# Patient Record
Sex: Male | Born: 1960 | Race: White | Hispanic: No | Marital: Single | Smoking: Former smoker
Health system: Southern US, Community
[De-identification: ages and names within clinical notes are randomized; demographics above are authoritative.]

## PROBLEM LIST (undated history)

## (undated) DIAGNOSIS — R030 Elevated blood-pressure reading, without diagnosis of hypertension: Secondary | ICD-10-CM

## (undated) HISTORY — DX: Elevated blood-pressure reading, without diagnosis of hypertension: R03.0

---

## 2014-01-05 ENCOUNTER — Encounter: Payer: Self-pay | Admitting: Emergency Medicine

## 2014-01-05 ENCOUNTER — Emergency Department
Admission: EM | Admit: 2014-01-05 | Discharge: 2014-01-05 | Disposition: A | Discharge status: Home / Self Care | Payer: BC Managed Care – PPO | Source: Home / Self Care | Attending: Family Medicine | Admitting: Family Medicine

## 2014-01-05 DIAGNOSIS — B9789 Other viral agents as the cause of diseases classified elsewhere: Secondary | ICD-10-CM

## 2014-01-05 DIAGNOSIS — J069 Acute upper respiratory infection, unspecified: Secondary | ICD-10-CM

## 2014-01-05 DIAGNOSIS — J029 Acute pharyngitis, unspecified: Secondary | ICD-10-CM

## 2014-01-05 LAB — POCT RAPID STREP A (OFFICE): Rapid Strep A Screen: NEGATIVE

## 2014-01-05 MED ORDER — AZITHROMYCIN 250 MG PO TABS: ORAL_TABLET | ORAL | Status: DC

## 2014-01-05 MED ORDER — BENZONATATE 200 MG PO CAPS: 200.0000 mg | ORAL_CAPSULE | Freq: Every day | ORAL | Status: DC

## 2014-01-05 NOTE — ED Notes (Signed)
Onset of sore throat, fever, cough, body aches, dizziness, nausea and headaches since Friday.

## 2014-01-05 NOTE — ED Provider Notes (Signed)
CSN: [id number]     Arrival date & time 01/05/14  1136 History   First MD Initiated Contact with Patient 01/05/14 1209     Chief Complaint  Patient presents with  . Sore Throat      HPI Comments: Four days ago patient developed nasal congestion and ears felt clogged.  He then developed sore throat, chills/sweats, fatigue, myalgias, and a cough.  The history is provided by the patient.    History reviewed. No pertinent past medical history. History reviewed. No pertinent past surgical history. History reviewed. No pertinent family history. History  Substance Use Topics  . Smoking status: Former Smoker    Quit date: 01/06/1984  . Smokeless tobacco: Not on file  . Alcohol Use: 5.0 oz/week    10 drink(s) per week    Review of Systems + sore throat + cough No pleuritic pain ? wheezing + nasal congestion + post-nasal drainage No sinus pain/pressure No itchy/red eyes No earache No hemoptysis No SOB No fever, + chills + nausea No vomiting No abdominal pain No diarrhea No urinary symptoms No skin rash + fatigue + myalgias + headache Used OTC meds without relief  Allergies  Review of patient's allergies indicates no known allergies.  Home Medications   Prior to Admission medications   Medication Sig Start Date End Date Taking? Authorizing Provider  azithromycin (ZITHROMAX Z-PAK) 250 MG tablet Take 2 tabs today; then begin one tab once daily for 4 more days. (Rx void after 01/13/14) 01/05/14   Lattie Haw, MD  benzonatate (TESSALON) 200 MG capsule Take 1 capsule (200 mg total) by mouth at bedtime. Take as needed for cough 01/05/14   Lattie Haw, MD   BP 127/88  Pulse 105  Temp(Src) 99.2 F (37.3 C) (Oral)  Resp 20  Ht  (1.905 m)  Wt 227 lb (102.967 kg)  BMI 28.37 kg/m2  SpO2 94% Physical Exam Nursing notes and Vital Signs reviewed. Appearance:  Patient appears healthy, stated age, and in no acute distress Eyes:  Pupils are equal, round, and reactive  to light and accomodation.  Extraocular movement is intact.  Conjunctivae are not inflamed  Ears:  Canals normal.  Tympanic membranes normal.  Nose:  Mildly congested turbinates.  No sinus tenderness.  Pharynx:  Normal Neck:  Supple.  Slightly tender shotty posterior nodes are palpated bilaterally  Lungs:  Clear to auscultation.  Breath sounds are equal.  Heart:  Regular rate and rhythm without murmurs, rubs, or gallops.  Abdomen:  Nontender without masses or hepatosplenomegaly.  Bowel sounds are present.  No CVA or flank tenderness.  Extremities:  No edema.  No calf tenderness Skin:  No rash present.   ED Course  Procedures      Labs Reviewed  POCT RAPID STREP A (OFFICE) negative      MDM   1. Acute pharyngitis, unspecified pharyngitis type   2. Viral URI with cough    There is no evidence of bacterial infection today.  Treat symptomatically for now  Prescription written for Benzonatate (Tessalon) to take at bedtime for night-time cough.  Take plain Mucinex (1200 mg guaifenesin) twice daily for cough and congestion.  May add Sudafed for sinus congestion.   Increase fluid intake, rest. May use Afrin nasal spray (or generic oxymetazoline) twice daily for about 5 days.  Also recommend using saline nasal spray several times daily and saline nasal irrigation (AYR is a common brand) Try warm salt water gargles for sore throat.  Stop  all antihistamines for now, and other non-prescription cough/cold preparations. May take Ibuprofen , 4 tabs every 8 hours with food for sore throat, fever, headache, etc. Begin Azithromycin if not improving about one week or if persistent fever develops (Given a prescription to hold, with an expiration date)  Follow-up with family doctor if not improving about10 days.     Lattie Haw, MD 01/10/14 [phone]

## 2014-01-05 NOTE — Discharge Instructions (Signed)
Take plain Mucinex (1200 mg guaifenesin) twice daily for cough and congestion.  May add Sudafed for sinus congestion.   Increase fluid intake, rest. May use Afrin nasal spray (or generic oxymetazoline) twice daily for about 5 days.  Also recommend using saline nasal spray several times daily and saline nasal irrigation (AYR is a common brand) Try warm salt water gargles for sore throat.  Stop all antihistamines for now, and other non-prescription cough/cold preparations. May take Ibuprofen , 4 tabs every 8 hours with food for sore throat, fever, headache, etc. Begin Azithromycin if not improving about one week or if persistent fever develops  Follow-up with family doctor if not improving about10 days.

## 2017-04-10 ENCOUNTER — Ambulatory Visit: Payer: BLUE CROSS/BLUE SHIELD | Admitting: Osteopathic Medicine

## 2017-04-10 ENCOUNTER — Telehealth: Payer: Self-pay | Admitting: Osteopathic Medicine

## 2017-04-10 NOTE — Telephone Encounter (Signed)
NO SHOW 04/10/17 to establish care with Dr Lyn HollingsheadAlexander  Please call and make sure everything ok and ask him to reschedule.

## 2017-04-10 NOTE — Telephone Encounter (Signed)
This was addressed by Selena BattenKim up front, no further action at this time

## 2017-04-11 ENCOUNTER — Ambulatory Visit (INDEPENDENT_AMBULATORY_CARE_PROVIDER_SITE_OTHER): Payer: BLUE CROSS/BLUE SHIELD | Admitting: Osteopathic Medicine

## 2017-04-11 ENCOUNTER — Encounter (INDEPENDENT_AMBULATORY_CARE_PROVIDER_SITE_OTHER): Payer: Self-pay

## 2017-04-11 ENCOUNTER — Encounter: Payer: Self-pay | Admitting: Osteopathic Medicine

## 2017-04-11 VITALS — BP 140/92

## 2017-04-11 DIAGNOSIS — Z13220 Encounter for screening for lipoid disorders: Secondary | ICD-10-CM | POA: Diagnosis not present

## 2017-04-11 DIAGNOSIS — F5109 Other insomnia not due to a substance or known physiological condition: Secondary | ICD-10-CM | POA: Insufficient documentation

## 2017-04-11 DIAGNOSIS — R030 Elevated blood-pressure reading, without diagnosis of hypertension: Secondary | ICD-10-CM

## 2017-04-11 DIAGNOSIS — Z23 Encounter for immunization: Secondary | ICD-10-CM | POA: Diagnosis not present

## 2017-04-11 DIAGNOSIS — F418 Other specified anxiety disorders: Secondary | ICD-10-CM | POA: Insufficient documentation

## 2017-04-11 HISTORY — DX: Elevated blood-pressure reading, without diagnosis of hypertension: R03.0

## 2017-04-11 MED ORDER — ESCITALOPRAM OXALATE 10 MG PO TABS: 10.0000 mg | ORAL_TABLET | Freq: Every day | ORAL | 1 refills | Status: DC

## 2017-04-11 MED ORDER — CLONAZEPAM 0.5 MG PO TABS: 0.5000 mg | ORAL_TABLET | Freq: Two times a day (BID) | ORAL | 1 refills | Status: DC | PRN

## 2017-04-11 NOTE — Progress Notes (Signed)
HPI: Philip Sutton is a 56 y.o. male who  has no past medical history on file.  he presents [facility] today, 04/11/17,  for chief complaint of:  Chief Complaint  Patient presents with  . Establish Care    Insomnia, Bloodwork,     Would like to get blood work done, concerned about cholesterol, thyroid, etc.   Concerned about insomnia issus, stress at work, personal life. Doesn't go into detail, but anticipates these issues will be present for a few months at least. In the past, he was on Ambien and Lunesta and these caused intolerable side effects and daytime drowsiness/frothiness such that he was not able to work effectively. She was on clonazepam daily at bedtime for a few months and came off of this without any problems.  At this point, he has some concerns about anxious depression, inability to turn off his thoughts, this seems to be the main cause of his insomnia problems.  BP a little high also on intake.      Past medical, surgical, social and family history reviewed:  There are no active problems to display for this patient.  History reviewed. No pertinent surgical history.  Social History   Tobacco Use  . Smoking status: Former Smoker    Last attempt to quit: 01/06/1984    Years since quitting: 33.2  Substance Use Topics  . Alcohol use: Yes    Alcohol/week: 5.0 oz    Types: 10 drink(s) per week   History reviewed. No pertinent family history.   Current medication list and allergy/intolerance information reviewed:    No current outpatient medications on file.   No current facility-administered medications for this visit.     No Known Allergies    Review of Systems:  Constitutional:  No  fever, no chills, No recent illness, No unintentional weight changes. No significant fatigue.   HEENT: No  headache, no vision change, no hearing change, No sore throat, No  sinus pressure  Cardiac: No  chest pain, No  pressure, No  palpitations, No  Orthopnea  Respiratory:  No  shortness of breath. No  Cough  Gastrointestinal: No  abdominal pain, No  nausea, No  vomiting,  No  blood in stool, No  diarrhea, No  constipation   Musculoskeletal: No new myalgia/arthralgia  Genitourinary: No  incontinence, No  abnormal genital bleeding, No abnormal genital discharge  Skin: No  Rash, No other wounds/concerning lesions  Hem/Onc: No  easy bruising/bleeding, No  abnormal lymph node  Endocrine: No cold intolerance,  No heat intolerance. No polyuria/polydipsia/polyphagia   Neurologic: No  weakness, No  dizziness, No  slurred speech/focal weakness/facial droop  Psychiatric: No  concerns with depression, +concerns with anxiety, +sleep problems, No mood problems  Exam:  BP (!) 140/92 (BP Location: Left Arm, Cuff Size: Normal)   Constitutional: VS see above. General Appearance: alert, well-developed, well-nourished, NAD  Eyes: Normal lids and conjunctive, non-icteric sclera  Ears, Nose, Mouth, Throat: MMM, Normal external inspection ears/nares/mouth/lips/gums.   Neck: No masses, trachea midline.   Respiratory: Normal respiratory effort. no wheeze, no rhonchi, no rales  Cardiovascular: S1/S2 normal, no murmur, no rub/gallop auscultated. RRR.   Musculoskeletal: Gait normal.   Neurological: Normal balance/coordination. No tremor. .   Skin: warm, dry  Psychiatric: Normal judgment/insight. Normal mood and affect. Oriented x3.      Depression screen PHQ 2/9 04/11/2017  Decreased Interest 2  Down, Depressed, Hopeless 2  PHQ - 2 Score 4  Altered sleeping 3  Tired, decreased energy 1  Change in appetite 0  Feeling bad or failure about yourself  0  Trouble concentrating 1  Moving slowly or fidgety/restless 1  Suicidal thoughts 0  PHQ-9 Score 10      ASSESSMENT/PLAN:   Long discussion of options for treatment of insomnia/anxiety.   Could certainly try treating for just insomnia, I'm a little reluctant to  use benzodiazepines for this but he seems to do well on this in the past and didn't have any issues coming off of the medications. Could certainly try something like trazodone.   I think given the anxiety issues more as primary cause of insomnia, treating with SSRI plus benzodiazepine bridge seems reasonable to me as first-line therapy for prolonged anxiety issues. Patient and I discussed risks versus benefits of several options including SSRIs/benzodiazepine, benzodiazepine alone, TCA alone.   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 This is a daily medication to help control your symptoms, including insomnia.   I also highly encourage my patients who are suffering from situational issues to seek care with a counselor or therapist. A therapist can coach you in techniques to recognize and deal with troubling thought patterns and behaviors. The ability to cope with external stressors is crucial to overall mental health. I can place a referral to behavioral health for counseling/therapy if you would like.   Try the Clonazepam medicine up to 2 times per day, try half dose first as this drug can cause sleepiness in the daytime, can take whole tablet a tnight. If you don't need it during the day, that's fine. Be aware that this medicine can cause problems with dependence - sparing or  short-term use is best.   Expect a call or message form this office in the next 2 weeks to check in: If you're doing well on the medicine but not feeling any effect, we can increase the dose. If you're starting to feel some effect/improvement, we can hold off on a dose increase and reevaluate at your office visit.   Let's plan to follow up in the office in 4 weeks. At that time, we can talk about how well the medicine is working for you, and we can consider increasing the dose, adding another medicine, etc.   If we are having trouble finding a good medication regimen for you, we can consult with a psychiatrist to assist with medication management.   If you experience problematic side effects, please let me know ASAP - we can switch the medicine any time, and we don't need an appointment for this.     Any questions or concerns, call me!      Visit summary with medication list and pertinent instructions was printed for patient to review. All questions at time of visit were answered - patient instructed to contact office with any additional concerns. ER/RTC precautions were reviewed with the patient. Follow-up plan:  Return in about 4 weeks (around 05/09/2017) for recheck insomnia, sooner if needed .  Note: Total time spent 45 minutes, greater than 50% of the visit was spent face-to-face counseling and coordinating care for the following: The primary encounter diagnosis was Situational insomnia. Diagnoses of Anxious depression, Need for immunization against influenza, Lipid screening, and Elevated blood pressure reading were also pertinent to this visit.Marland Kitchen  Please note: voice recognition software was used to produce this document, and typos may escape review. Please contact Dr. Lyn Hollingshead for any needed clarifications.

## 2017-04-11 NOTE — Patient Instructions (Signed)
We are starting a medication today called lexapro to help treat your anxious depression. This is a daily medication to help control your symptoms, including insomnia.   I also highly encourage my patients who are suffering from situational issues to seek care with a counselor or therapist. A therapist can coach you in techniques to recognize and deal with troubling thought patterns and behaviors. The ability to cope with external stressors is crucial to overall mental health. I can place a referral to behavioral health for counseling/therapy if you would like.   Try the Clonazepam medicine up to 2 times per day, try half dose first as this drug can cause sleepiness in the daytime, can take whole tablet a tnight. If you don't need it during the day, that's fine. Be aware that this medicine can cause problems with dependence - sparing or short-term use is best.   Expect a call or message form this office in the next 2 weeks to check in: If you're doing well on the medicine but not feeling any effect, we can increase the dose. If you're starting to feel some effect/improvement, we can hold off on a dose increase and reevaluate at your office visit.   Let's plan to follow up in the office in 4 weeks. At that time, we can talk about how well the medicine is working for you, and we can consider increasing the dose, adding another medicine, etc.   If we are having trouble finding a good medication regimen for you, we can consult with a psychiatrist to assist with medication management.   If you experience problematic side effects, please let me know ASAP - we can switch the medicine any time, and we don't need an appointment for this.     Any questions or concerns, call me!

## 2017-04-12 ENCOUNTER — Other Ambulatory Visit: Payer: Self-pay | Admitting: Osteopathic Medicine

## 2017-04-12 DIAGNOSIS — R7309 Other abnormal glucose: Secondary | ICD-10-CM

## 2017-04-12 NOTE — Progress Notes (Signed)
A1C for Glc high

## 2017-04-16 LAB — COMPLETE METABOLIC PANEL WITH GFR
AG Ratio: 1.5 (calc) (ref 1.0–2.5)
ALBUMIN MSPROF: 4.4 g/dL (ref 3.6–5.1)
ALT: 18 U/L (ref 9–46)
AST: 18 U/L (ref 10–35)
Alkaline phosphatase (APISO): 68 U/L (ref 40–115)
BUN: 21 mg/dL (ref 7–25)
CO2: 28 mmol/L (ref 20–32)
Calcium: 9.8 mg/dL (ref 8.6–10.3)
Chloride: 105 mmol/L (ref 98–110)
Creat: 1.07 mg/dL (ref 0.70–1.33)
GFR, EST NON AFRICAN AMERICAN: 77 mL/min/{1.73_m2} (ref >=60)
GFR, Est African American: 89 mL/min/{1.73_m2} (ref >=60)
GLOBULIN: 2.9 g/dL (ref 1.9–3.7)
Glucose, Bld: 104 mg/dL — ABNORMAL HIGH (ref 65–99)
Potassium: 4.6 mmol/L (ref 3.5–5.3)
SODIUM: 140 mmol/L (ref 135–146)
Total Bilirubin: 0.7 mg/dL (ref 0.2–1.2)
Total Protein: 7.3 g/dL (ref 6.1–8.1)

## 2017-04-16 LAB — CBC
HEMATOCRIT: 43.1 % (ref 38.5–50.0)
HEMOGLOBIN: 15.5 g/dL (ref 13.2–17.1)
MCH: 32.4 pg (ref 27.0–33.0)
MCHC: 36 g/dL (ref 32.0–36.0)
MCV: 90 fL (ref 80.0–100.0)
MPV: 10.2 fL (ref 7.5–12.5)
Platelets: 265 10*3/uL (ref 140–400)
RBC: 4.79 10*6/uL (ref 4.20–5.80)
RDW: 13 % (ref 11.0–15.0)
WBC: 7.6 10*3/uL (ref 3.8–10.8)

## 2017-04-16 LAB — LIPID PANEL
CHOL/HDL RATIO: 4.5 (calc) (ref <5.0)
Cholesterol: 228 mg/dL — ABNORMAL HIGH (ref <200)
HDL: 51 mg/dL (ref >40)
LDL Cholesterol (Calc): 140 mg/dL (calc) — ABNORMAL HIGH
Non-HDL Cholesterol (Calc): 177 mg/dL (calc) — ABNORMAL HIGH (ref <130)
Triglycerides: 227 mg/dL — ABNORMAL HIGH (ref <150)

## 2017-04-16 LAB — VITAMIN D 25 HYDROXY (VIT D DEFICIENCY, FRACTURES): Vit D, 25-Hydroxy: 23 ng/mL — ABNORMAL LOW (ref 30–100)

## 2017-04-16 LAB — HEMOGLOBIN A1C
EAG (MMOL/L): 5 (calc)
Hgb A1c MFr Bld: 4.8 % of total Hgb (ref <5.7)
Mean Plasma Glucose: 91 (calc)

## 2017-04-16 LAB — TSH: TSH: 2.69 m[IU]/L (ref 0.40–4.50)

## 2017-04-25 ENCOUNTER — Ambulatory Visit (INDEPENDENT_AMBULATORY_CARE_PROVIDER_SITE_OTHER): Payer: BLUE CROSS/BLUE SHIELD

## 2017-04-25 ENCOUNTER — Ambulatory Visit (INDEPENDENT_AMBULATORY_CARE_PROVIDER_SITE_OTHER): Payer: BLUE CROSS/BLUE SHIELD | Admitting: Family Medicine

## 2017-04-25 ENCOUNTER — Telehealth: Payer: Self-pay | Admitting: Osteopathic Medicine

## 2017-04-25 DIAGNOSIS — M25562 Pain in left knee: Secondary | ICD-10-CM

## 2017-04-25 DIAGNOSIS — M25462 Effusion, left knee: Secondary | ICD-10-CM

## 2017-04-25 DIAGNOSIS — M25561 Pain in right knee: Secondary | ICD-10-CM | POA: Diagnosis not present

## 2017-04-25 MED ORDER — DICLOFENAC SODIUM 1 % TD GEL: 4.0000 g | Freq: Four times a day (QID) | TRANSDERMAL | 11 refills | Status: AC

## 2017-04-25 NOTE — Telephone Encounter (Signed)
Patient inquired about changing his Lexapro to Klonopin. He believes Klonopin works better for him. Please advise. Thanks!

## 2017-04-25 NOTE — Patient Instructions (Signed)
Thank you for coming in today. Attend PT.  Do the Eccentric hamstring exercises. Extend your knee slowly against resistance.  Do the The Askling L Protocol for Hamstring Strains  Apply voltaren gel up to 4x daily.  Use a compressive knee sleeve.  I recommend Body Helix full knee.   I do recommend a MRI but we can decide on that later if not better.    Hamstring Strain A hamstring strain is an injury that occurs when the hamstring muscles are overstretched or overloaded. The hamstring muscles are a group of muscles at the back of the thighs. These muscles are used in straightening the hips, bending the knees, and pulling back the legs. This type of injury is often called a pulled hamstring muscle. The severity of a muscle strain is rated in degrees. First-degree strains have the least amount of muscle fiber tearing and pain. Second-degree and third-degree strains have increasingly more tearing and pain. What are the causes? Hamstring strains occur when a sudden, violent force is placed on these muscles and stretches them too far. This often occurs during activities that involve running, jumping, kicking, or weight lifting. What increases the risk? Hamstring strains are especially common in athletes. Other things that can increase your risk for this injury include:  Having low strength, endurance, or flexibility of the hamstring muscles.  Performing high-impact physical activity.  Having poor physical fitness.  Having a previous leg injury.  Having fatigued muscles.  Older age.  What are the signs or symptoms?  Pain in the back of the thigh.  Bruising.  Swelling.  Muscle spasm.  Difficulty using the muscle because of pain or lack of normal function. For severe strains, you may have a popping or snapping feeling when the injury occurs. How is this diagnosed? Your health care provider will perform a physical exam and ask about your medical history. How is this  treated? Often, the best treatment for a hamstring strain is protecting, resting, icing, applying compression, and elevating the injured area. This is referred to as the PRICE method of treatment. Your health care provider may also recommend medicines to help reduce pain or inflammation. Follow these instructions at home:  Use the PRICE method of treatment to promote muscle healing during the first 2-3 days after your injury. The PRICE method involves: ? P-Protecting the muscle from being injured again. ? R-Restricting your activity and resting the injured body part. ? I-Icing your injury. To do this, put ice in a plastic bag. Place a towel between your skin and the bag. Then, apply the ice and leave it on for 20 minutes, 2-3 times per day. After the third day, switch to moist heat packs. ? C-Applying compression to the injured area with an elastic bandage. Be careful not to wrap it too tightly. That may interfere with blood circulation or may increase swelling. ? E-Elevating the injured body part above the level of your heart as often as you can. You can do this by putting a pillow under your thigh when you sit or lie down.  Take medicines only as directed by your health care provider.  Begin exercising or stretching as directed by your health care provider.  Do not return to full activity level until your health care provider approves.  Keep all follow-up visits as directed by your health care provider. This is important. Contact a health care provider if:  You have increasing pain or swelling in the injured area.  You have numbness, tingling, or  a significant loss of strength in the injured area.  Your foot or your toes become cold or turn blue. This information is not intended to replace advice given to you by your health care provider. Make sure you discuss any questions you have with your health care provider. Document Released: 01/17/2001 Document Revised: 09/30/2015 Document  Reviewed: 12/08/2013 [employer] Interactive Patient Education  2018 [employer]

## 2017-04-25 NOTE — Progress Notes (Signed)
Subjective:    I'm seeing this patient as a consultation for: Philip Sutton, Natalie, DO   CC: Left Knee Pain  HPI:  Philip MaduroRobert has pain at the left posterior medial knee for years.  He notes the pain is worse when he plays racquetball better with rest.  He notes pain and swelling.  He denies significant locking or catching.  He uses a patella strap which does help some.  He denies any radiating pain weakness or numbness.  Racquetball is very important to him and help some exercise and improve his quality of life.  He had a period of time a few years ago when he was not playing racquetball and he did not have any knee pain.  He denies any fevers or chills nausea vomiting diarrhea weight loss.   Past medical history, Surgical history, Family history not pertinant except as noted below, Social history, Allergies, and medications have been entered into the medical record, reviewed, and no changes needed.   Review of Systems: No headache, visual changes, nausea, vomiting, diarrhea, constipation, dizziness, abdominal pain, skin rash, fevers, chills, night sweats, weight loss, swollen lymph nodes, body aches, joint swelling, muscle aches, chest pain, shortness of breath, mood changes, visual or auditory hallucinations.   Objective:    Vitals:   04/25/17 0850  BP: (!) 155/90  Pulse: 62   General: Well Developed, well nourished, and in no acute distress.  Neuro/Psych: Alert and oriented x3, extra-ocular muscles intact, able to move all 4 extremities, sensation grossly intact. Skin: Warm and dry, no rashes noted.  Respiratory: Not using accessory muscles, speaking in full sentences, trachea midline.  Cardiovascular: Pulses palpable, no extremity edema. Abdomen: Does not appear distended. MSK: Left knee normal-appearing no effusion.  No erythema. Small palpable mildly tender nodule present at the left posterior medial knee near the insertion of the semitendinosus tendon. Range of motion is normal at  0-120 degrees. Stable ligamentous exam. Intact flexion and extension strength. Normal gait.  Limited musculoskeletal ultrasound of the left posterior knee reveals a heterogeneous appearance structure with some hyperechoic, hypoechoic changes and occasional scattered Doppler flow indicating neovascularity.  No significant blood flow is seen within the abnormal appearing structure.  Bony structures are otherwise normal-appearing Impression: Poorly healed muscle tear versus partial Baker's cyst versus malignancy  No results found for this or any previous visit (from the past 24 hour(s)). Dg Knee 1-2 Views Right  Result Date: 04/25/2017 CLINICAL DATA:  Knee pain EXAM: RIGHT KNEE - 1-2 VIEW COMPARISON:  None. FINDINGS: No evidence of fracture, dislocation, or joint effusion. No evidence of arthropathy or other focal bone abnormality. Soft tissues are unremarkable. IMPRESSION: Negative. Electronically Signed   By: Marlan Palauharles  Clark M.D.   On: 04/25/2017 13:01   Dg Knee Complete 4 Views Left  Result Date: 04/25/2017 CLINICAL DATA:  Knee pain EXAM: LEFT KNEE - COMPLETE 4+ VIEW COMPARISON:  None. FINDINGS: Moderate joint space narrowing medially without significant spurring. Lateral joint space normal. Mild patellofemoral spurring with a small joint effusion. Negative for fracture. Varus deformity of the knee. IMPRESSION: Moderate joint space narrowing in the medial joint compartment. Small joint effusion. Mild patellofemoral degenerative change. Electronically Signed   By: Marlan Palauharles  Clark M.D.   On: 04/25/2017 13:02    Impression and Recommendations:    Assessment and Plan: 56 y.o. male with left posterior knee pain.  This is likely a poorly healed medial hamstring tear or strain.  However based on the appearance of ultrasound I do think an  MRI with IV contrast will be helpful to rule out malignancy.  Mr. Philip Sutton is reluctant to pursue this further.  He notes that the symptoms have been ongoing for years and  have not worsened.  I also think it is unlikely that he has a malignancy.  In the meantime we will treat with referral to physical therapy and home exercise program as well as topical diclofenac gel.  Check in about 6 weeks.  Follow-up with PCP regarding elevated blood pressure.   Orders Placed This Encounter  Procedures  . DG Knee 1-2 Views Right    Standing Status:   Future    Number of Occurrences:   1    Standing Expiration Date:   06/26/2018    Order Specific Question:   Reason for Exam (SYMPTOM  OR DIAGNOSIS REQUIRED)    Answer:   For use with the left knee x-ray bilateral AP and Rosenberg standing.    Order Specific Question:   Preferred imaging location?    Answer:   [facility]  . DG Knee Complete 4 Views Left    Please include patellar sunrise, lateral, and weightbearing bilateral AP and bilateral rosenberg views    Standing Status:   Future    Number of Occurrences:   1    Standing Expiration Date:   06/25/2018    Order Specific Question:   Reason for exam:    Answer:   Please include patellar sunrise, lateral, and weightbearing bilateral AP and bilateral rosenberg views    Comments:   Please include patellar sunrise, lateral, and weightbearing bilateral AP and bilateral rosenberg views    Order Specific Question:   Preferred imaging location?    Answer:   [facility]   . Ambulatory referral to Physical Therapy    Referral Priority:   Routine    Referral Type:   Physical Medicine    Referral Reason:   Specialty Services Required    Requested Specialty:   Physical Therapy   Meds ordered this encounter  Medications  . diclofenac sodium (VOLTAREN) 1 % GEL    Sig: Apply 4 g topically 4 (four) times daily. To affected joint.    Dispense:  100 g    Refill:  11    Discussed warning signs or symptoms. Please see discharge instructions. Patient expresses understanding.

## 2017-04-26 ENCOUNTER — Encounter: Payer: Self-pay | Admitting: Family Medicine

## 2017-04-26 NOTE — Telephone Encounter (Signed)
Please call patient: He and I discussed at last visit that I am not particularly comfortable using Klonopin as a daily sedative for sleep or anxiety issues. If I were to do this, it would be short term. I would prescribe no more than 3 months total for daily use of this medication.   I sent a prescription for Klonopin 04/11/2017, 60 tablets with one refill. If he wants to stop the Lexapro, he can do so, but I do not advise doing this as the Klonopin will not fix the underlying anxiety/depression.   Any further questions, he is due to follow up with me 4-6 weeks after his initial visit, please ask him to schedule an appointment, he can see me sooner if needed.

## 2017-04-26 NOTE — Telephone Encounter (Signed)
Contacted patient - as per patient, does not want to use Klonopin long term. Patient stopped use of Lexapro after 4th day due to increase anxiety. Patient was transferred to scheduled desk to make a follow up appt.

## 2017-05-02 ENCOUNTER — Ambulatory Visit (INDEPENDENT_AMBULATORY_CARE_PROVIDER_SITE_OTHER): Payer: BLUE CROSS/BLUE SHIELD | Admitting: Osteopathic Medicine

## 2017-05-02 ENCOUNTER — Encounter: Payer: Self-pay | Admitting: Osteopathic Medicine

## 2017-05-02 VITALS — BP 144/88 | HR 55 | Wt 239.0 lb

## 2017-05-02 DIAGNOSIS — F5109 Other insomnia not due to a substance or known physiological condition: Secondary | ICD-10-CM

## 2017-05-02 DIAGNOSIS — R030 Elevated blood-pressure reading, without diagnosis of hypertension: Secondary | ICD-10-CM

## 2017-05-02 DIAGNOSIS — L659 Nonscarring hair loss, unspecified: Secondary | ICD-10-CM

## 2017-05-02 DIAGNOSIS — F418 Other specified anxiety disorders: Secondary | ICD-10-CM

## 2017-05-02 MED ORDER — FINASTERIDE 1 MG PO TABS: 1.0000 mg | ORAL_TABLET | Freq: Every day | ORAL | 3 refills | Status: DC

## 2017-05-02 MED ORDER — BUPROPION HCL ER (XL) 150 MG PO TB24: 150.0000 mg | ORAL_TABLET | Freq: Every day | ORAL | 0 refills | Status: AC

## 2017-05-02 NOTE — Progress Notes (Signed)
HPI: Philip Sutton is a 56 y.o. male who  has a past medical history of Elevated blood pressure reading (04/11/2017).  [facility], 05/02/17,  for chief complaint of:  Chief Complaint  Patient presents with  . Medication Management    Stopped Lexapro, this was making him more anxious. Getting even fewer hours of sleep. Has been on Klonopin before with good effect and didn't have issues coming off this medicine. Situational factors affecting anxiety have not changed, he'd still like to get this issue under control and wean off the Klonopin eventually.   HTN: BP high on intake today. Better on recheck. CP/SOB.   Hair loss: requests Rx for Propecia.      Past medical, surgical, social and family history reviewed:  Patient Active Problem List   Diagnosis Date Noted  . Left knee pain 04/25/2017  . 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 Elevated blood pressure reading 04/11/2017   No past surgical history on file.  Social History   Tobacco Use  . Smoking status: Former Smoker    Types: Cigars    Last attempt to quit: 01/06/1984    Years since quitting: 33.3  . Smokeless tobacco: Never Used  Substance Use Topics  . Alcohol use: Yes    Alcohol/week: 5.0 oz    Types: 10 Standard drinks or equivalent per week   No family history on file.   Current medication list and allergy/intolerance information reviewed:     No Known Allergies    Review of Systems:  Constitutional:  No  fever, no chills, No recent illness  HEENT: No  headache  Cardiac: No  chest pain, No  pressure, No palpitations, No  Orthopnea  Respiratory:  No  shortness of breath. No  Cough  Gastrointestinal: No  abdominal pain, No  nause  Musculoskeletal: No new myalgia/arthralgia  Neurologic: No  weakness, No  dizziness  Psychiatric: +concerns with depression, +concerns with anxiety, +sleep problems, No mood  problems  Exam:  BP (!) 144/88 (BP Location: Right Arm)   Pulse (!) 55   Wt 239 lb 0.6 oz (108.4 kg)   BMI 29.88 kg/m   Constitutional: VS see above. General Appearance: alert, well-developed, well-nourished, NAD  Eyes: Normal lids and conjunctive, non-icteric sclera  Ears, Nose, Mouth, Throat: MMM, Normal external inspection ears/nares/mouth/lips/gums.  Neck: No masses, trachea midline.  Respiratory: Normal respiratory effort. no wheeze, no rhonchi, no rales  Cardiovascular: S1/S2 normal, no murmur, no rub/gallop auscultated. RRR  Musculoskeletal: Gait normal.   Neurological: Normal balance/coordination. No tremor  Skin: warm, dry, intact. No rash/ulcer.    Psychiatric: Normal judgment/insight. Normal mood and affect. Oriented x3.    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 Diagnoses of Situational insomnia, Elevated blood pressure reading, and Hair loss were also pertinent to this visit.   Consider brand-name antidepressant/antianxiety Rx if issues with Wellbutrin, or consider Belsomra or other nonbenzodiazepine/nonsedative insomnia treatment.     Patient Instructions  Plan:  Continue the Klonopin (  you should have another refill   Cancel Lexapro (double check with pharmacy)   Start Wellbutrin (sent to pharmacy)  Trial Propecia (may take few months to see any benefit for hair loss for this)     Visit summary with medication list and pertinent instructions was printed for patient to review. All questions at time of visit were answered - patient instructed to contact office  with any additional concerns. ER/RTC precautions were reviewed with the patient.   Follow-up plan: Return in about 4 weeks (around 05/30/2017) for recheck insomnia/anxiety, call/,message us sooner if needed.  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 Diagnoses of Situational insomnia, Elevated blood pressure reading, and Hair loss were also pertinent to this visit.Marland Kitchen.  Please note: voice recognition software was used to produce this document, and typos may escape review. Please contact Dr. Lyn HollingsheadAlexander for any needed clarifications.

## 2017-05-02 NOTE — Patient Instructions (Addendum)
Plan:  Continue the Klonopin (you should have another refill   Cancel Lexapro (double check with pharmacy)   Start Wellbutrin (sent to pharmacy)  Trial Propecia (may take few months to see any benefit for hair loss for this)

## 2017-06-30 ENCOUNTER — Other Ambulatory Visit: Payer: Self-pay | Admitting: Osteopathic Medicine

## 2017-06-30 DIAGNOSIS — F418 Other specified anxiety disorders: Secondary | ICD-10-CM

## 2017-06-30 DIAGNOSIS — F5109 Other insomnia not due to a substance or known physiological condition: Secondary | ICD-10-CM

## 2018-04-16 ENCOUNTER — Other Ambulatory Visit: Payer: Self-pay | Admitting: Osteopathic Medicine

## 2018-05-23 ENCOUNTER — Other Ambulatory Visit: Payer: Self-pay | Admitting: Osteopathic Medicine

## 2018-10-02 IMAGING — DX DG KNEE COMPLETE 4+V*L*
4 series · 4 of 4 positions shown · non-contrast
Comparison: None.

CLINICAL DATA: Knee pain

EXAM:
LEFT KNEE - COMPLETE 4+ VIEW

[knee lat]
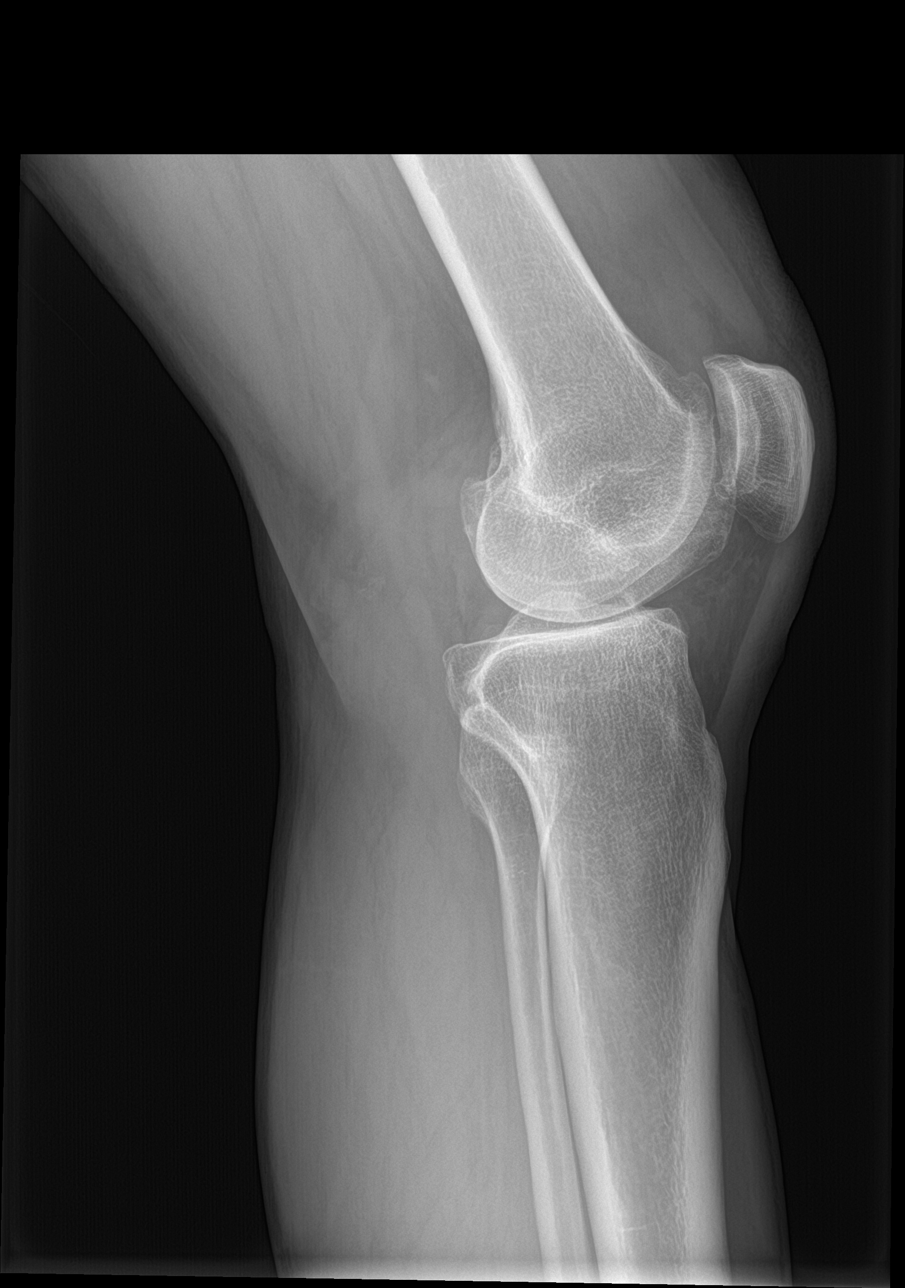

[knee sunrise]
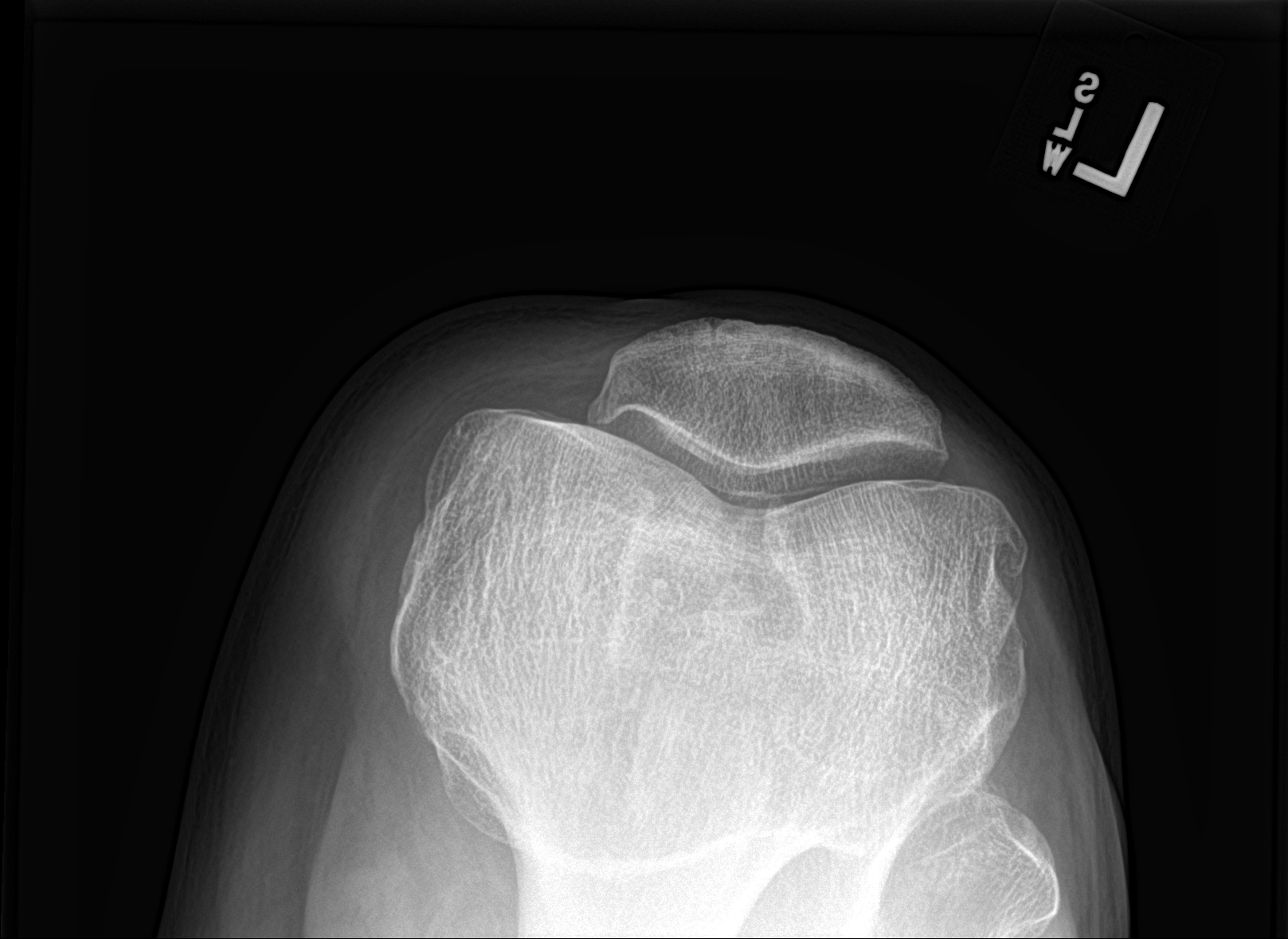

[knee ap bilat standing (1 of 2)]
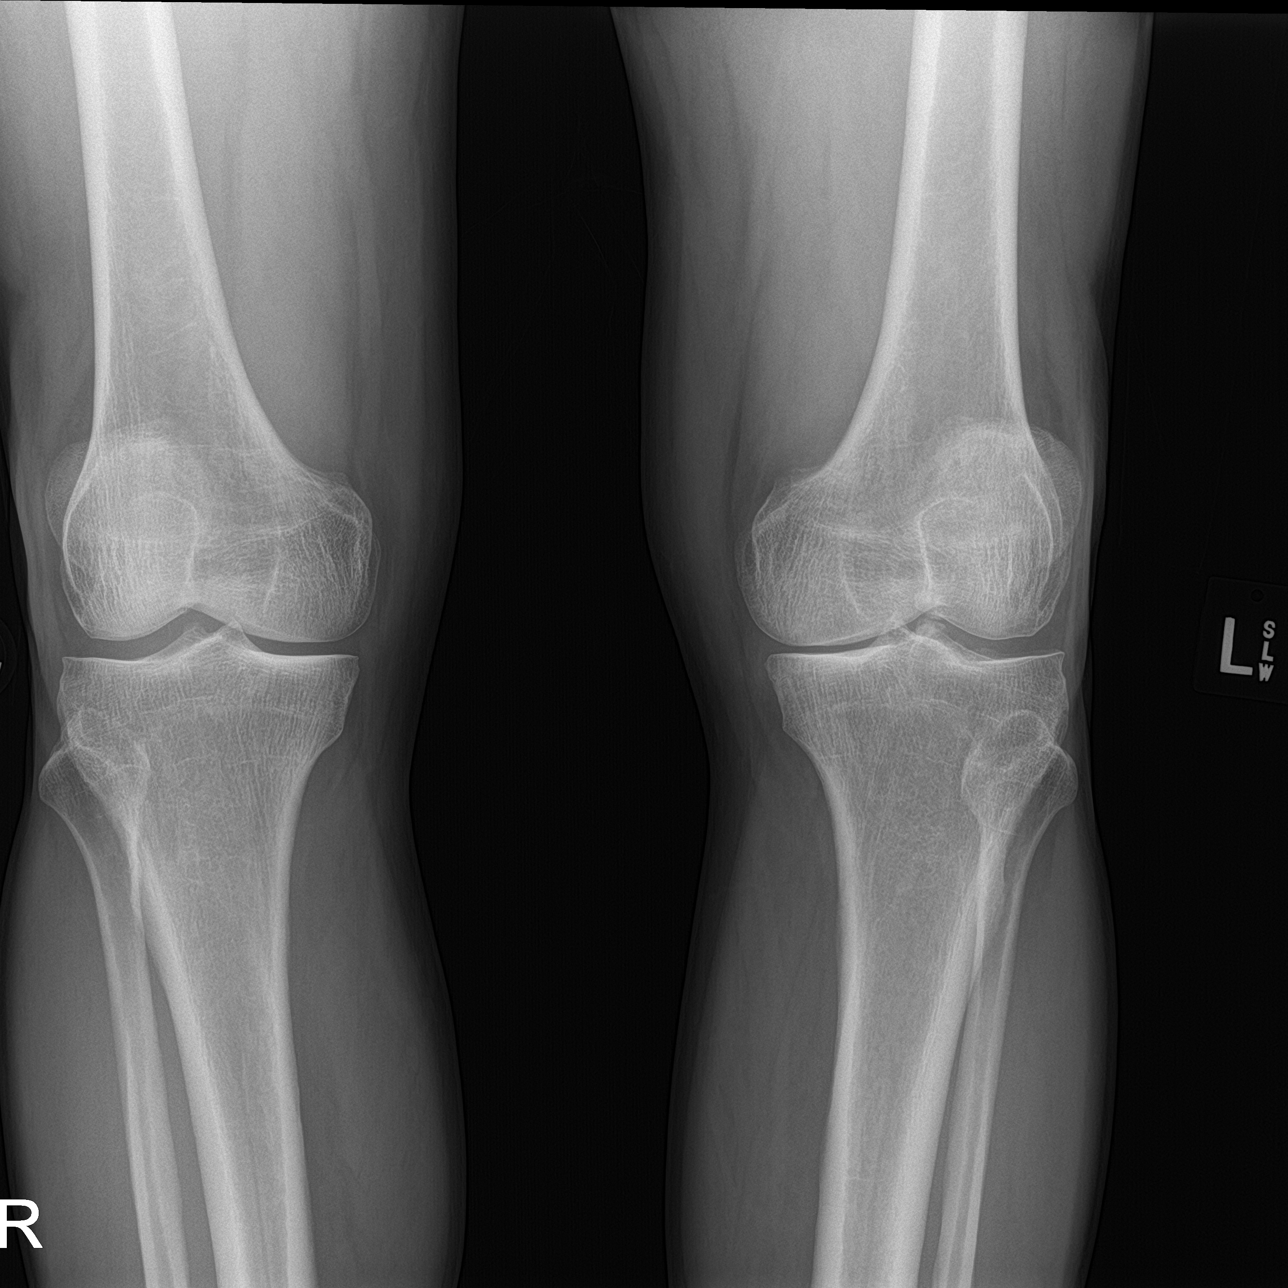

[knee ap bilat standing (2 of 2)]
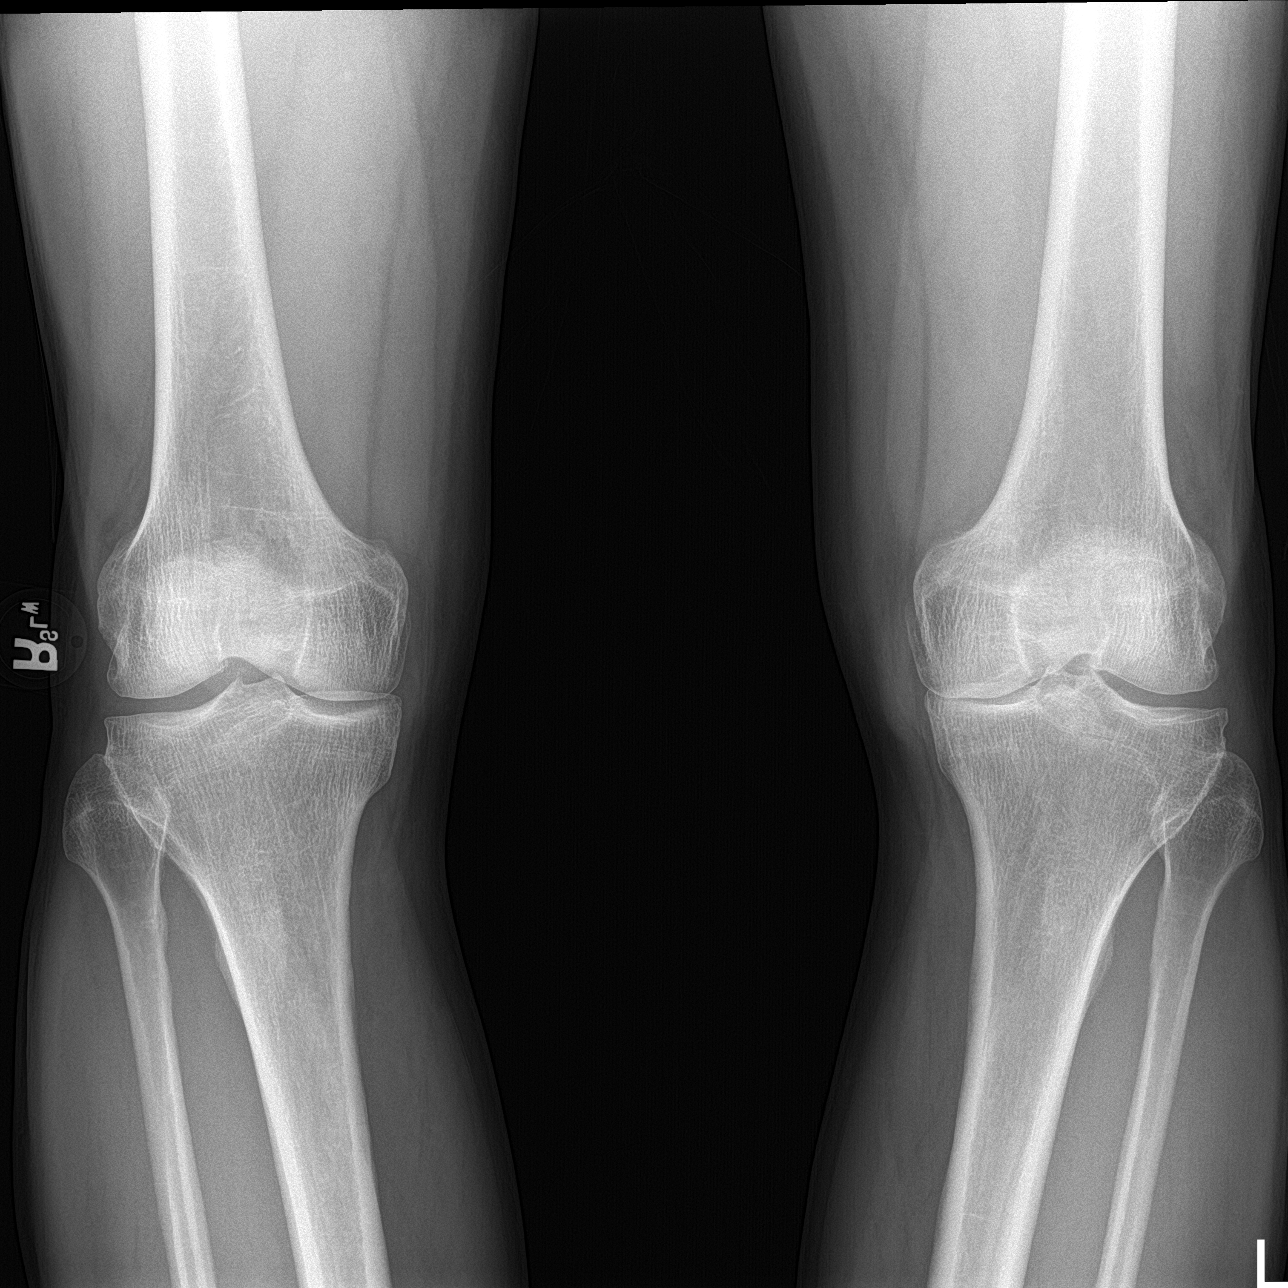

[4 of 4 positions shown; findings below may reference images not displayed]

FINDINGS: Moderate joint space narrowing medially without significant
spurring. Lateral joint space normal. Mild patellofemoral spurring
with a small joint effusion. Negative for fracture. Varus deformity
of the knee.
IMPRESSION: Moderate joint space narrowing in the medial joint compartment.
Small joint effusion. Mild patellofemoral degenerative change.
